# Patient Record
Sex: Female | Born: 1975 | Race: Black or African American | Hispanic: No | Marital: Single | Smoking: Current every day smoker
Health system: Southern US, Community
[De-identification: ages and names within clinical notes are randomized; demographics above are authoritative.]

## PROBLEM LIST (undated history)

## (undated) DIAGNOSIS — R569 Unspecified convulsions: Secondary | ICD-10-CM

## (undated) HISTORY — PX: FOOT SURGERY: SHX648

---

## 2016-05-24 ENCOUNTER — Other Ambulatory Visit: Payer: Self-pay | Admitting: Emergency Medicine

## 2016-10-30 ENCOUNTER — Encounter (HOSPITAL_COMMUNITY): Payer: Self-pay | Admitting: Emergency Medicine

## 2016-10-30 ENCOUNTER — Ambulatory Visit (HOSPITAL_COMMUNITY)
Admission: EM | Admit: 2016-10-30 | Discharge: 2016-10-30 | Disposition: A | Discharge status: Home / Self Care | Payer: BLUE CROSS/BLUE SHIELD | Attending: Family Medicine | Admitting: Family Medicine

## 2016-10-30 DIAGNOSIS — R0602 Shortness of breath: Secondary | ICD-10-CM | POA: Diagnosis not present

## 2016-10-30 DIAGNOSIS — F17200 Nicotine dependence, unspecified, uncomplicated: Secondary | ICD-10-CM

## 2016-10-30 DIAGNOSIS — J40 Bronchitis, not specified as acute or chronic: Secondary | ICD-10-CM | POA: Diagnosis not present

## 2016-10-30 DIAGNOSIS — R05 Cough: Secondary | ICD-10-CM

## 2016-10-30 HISTORY — DX: Unspecified convulsions: R56.9

## 2016-10-30 MED ORDER — AZITHROMYCIN 250 MG PO TABS: 250.0000 mg | ORAL_TABLET | Freq: Every day | ORAL | 0 refills | Status: AC

## 2016-10-30 MED ORDER — IPRATROPIUM-ALBUTEROL 0.5-2.5 (3) MG/3ML IN SOLN
3.0000 mL | Freq: Once | RESPIRATORY_TRACT | Status: AC
Start: 2016-10-30 — End: 2016-10-30
  Administered 2016-10-30: 3 mL via RESPIRATORY_TRACT

## 2016-10-30 MED ORDER — ALBUTEROL SULFATE HFA 108 (90 BASE) MCG/ACT IN AERS: 2.0000 | INHALATION_SPRAY | RESPIRATORY_TRACT | 1 refills | Status: AC | PRN

## 2016-10-30 MED ORDER — IPRATROPIUM-ALBUTEROL 0.5-2.5 (3) MG/3ML IN SOLN
RESPIRATORY_TRACT | Status: AC
  Filled 2016-10-30: qty 3

## 2016-10-30 MED ORDER — PREDNISONE 20 MG PO TABS: ORAL_TABLET | ORAL | 0 refills | Status: AC

## 2016-10-30 NOTE — ED Triage Notes (Signed)
The patient [facility] with a complaint of a cough x 1 week and SOB that started yesterday.

## 2016-10-30 NOTE — ED Provider Notes (Signed)
[facility]    CSN: [id number] Arrival date & time: 10/30/16  1613     History   Chief Complaint Chief Complaint  Patient presents with  . Cough    HPI Chloe Baker is a 41 y.o. female.   The patient presented to the [facility] with a complaint of a cough x 1 week and SOB that started yesterday. She's a smoker with h/o bronchitis.  No fever.      Past Medical History:  Diagnosis Date  . Seizure (HCC)     There are no active problems to display for this patient.   Past Surgical History:  Procedure Laterality Date  . FOOT SURGERY Left     OB History    No data available       Home Medications    Prior to Admission medications   Medication Sig Start Date End Date Taking? Authorizing Provider  lamoTRIgine (LAMICTAL) 25 MG tablet Take 25 mg by mouth daily.   Yes [provider]  levETIRAcetam (KEPPRA) 750 MG tablet Take 750 mg by mouth 2 (two) times daily.   Yes [provider]  topiramate (TOPAMAX) 50 MG tablet Take 50 mg by mouth 2 (two) times daily.   Yes [provider]  albuterol (PROVENTIL HFA;VENTOLIN HFA) 108 (90 Base) MCG/ACT inhaler Inhale 2 puffs into the lungs every 4 (four) hours as needed for wheezing or shortness of breath (cough, shortness of breath or wheezing.). 10/30/16   Elvina Sidle, MD  azithromycin (ZITHROMAX) 250 MG tablet Take 1 tablet (250 mg total) by mouth daily. Take first 2 tablets together, then 1 every day until finished. 10/30/16   Elvina Sidle, MD  predniSONE (DELTASONE) 20 MG tablet Two daily with food 10/30/16   Elvina Sidle, MD    Family History History reviewed. No pertinent family history.  Social History Social History  Substance Use Topics  . Smoking status: Current Every Day Smoker    Packs/day: 0.50    Years: 10.00    Types: Cigarettes  . Smokeless tobacco: Never Used  . Alcohol use No     Allergies   Penicillins   Review of Systems Review of Systems    Respiratory: Positive for cough, shortness of breath and wheezing.   All other systems reviewed and are negative.    Physical Exam Triage Vital Signs ED Triage Vitals [10/30/16 1635]  Enc Vitals Group     BP      Pulse      Resp      Temp      Temp src      SpO2      Weight      Height      Head Circumference      Peak Flow      Pain Score 0     Pain Loc      Pain Edu?      Excl. in GC?    No data found.   Updated Vital Signs LMP  (Exact Date)    Physical Exam  Constitutional: She is oriented to person, place, and time. She appears well-developed and well-nourished.  HENT:  Right Ear: External ear normal.  Left Ear: External ear normal.  Eyes: Conjunctivae are normal. Pupils are equal, round, and reactive to light.  Neck: Normal range of motion. Neck supple.  Cardiovascular: Normal rate, regular rhythm and normal heart sounds.   Pulmonary/Chest: Effort normal. She has wheezes.  Musculoskeletal: Normal range of motion.  Neurological: She  is alert and oriented to person, place, and time.  Skin: Skin is warm and dry.  Nursing note and vitals reviewed.    UC Treatments / Results  Labs (all labs ordered are listed, but only abnormal results are displayed) Labs Reviewed - No data to display  EKG  EKG Interpretation None       Radiology No results found.  Procedures Procedures (including critical care time)  Medications Ordered in UC Medications  ipratropium-albuterol (DUONEB) 0.5-2.5 (3) MG/3ML nebulizer solution 3 mL (not administered)     Initial Impression / Assessment and Plan / UC Course  I have reviewed the triage vital signs and the nursing notes.  Pertinent labs & imaging results that were available during my care of the patient were reviewed by me and considered in my medical decision making (see chart for details).     Final Clinical Impressions(s) / UC Diagnoses   Final diagnoses:  Bronchitis    New Prescriptions New  Prescriptions   ALBUTEROL (PROVENTIL HFA;VENTOLIN HFA) 108 (90 BASE) MCG/ACT INHALER    Inhale 2 puffs into the lungs every 4 (four) hours as needed for wheezing or shortness of breath (cough, shortness of breath or wheezing.).   AZITHROMYCIN (ZITHROMAX) 250 MG TABLET    Take 1 tablet (250 mg total) by mouth daily. Take first 2 tablets together, then 1 every day until finished.   PREDNISONE (DELTASONE) 20 MG TABLET    Two daily with food     Elvina SidleLauenstein, Grason Brailsford, MD 10/30/16 [phone]

## 2018-07-27 ENCOUNTER — Emergency Department (HOSPITAL_COMMUNITY)
Admission: EM | Admit: 2018-07-27 | Discharge: 2018-07-27 | Disposition: A | Discharge status: Home / Self Care | Payer: Medicaid Other | Attending: Emergency Medicine | Admitting: Emergency Medicine

## 2018-07-27 ENCOUNTER — Encounter (HOSPITAL_COMMUNITY): Payer: Self-pay | Admitting: Obstetrics and Gynecology

## 2018-07-27 ENCOUNTER — Emergency Department (HOSPITAL_COMMUNITY): Payer: Medicaid Other

## 2018-07-27 ENCOUNTER — Other Ambulatory Visit: Payer: Self-pay

## 2018-07-27 DIAGNOSIS — R569 Unspecified convulsions: Secondary | ICD-10-CM | POA: Diagnosis not present

## 2018-07-27 DIAGNOSIS — Z79899 Other long term (current) drug therapy: Secondary | ICD-10-CM | POA: Diagnosis not present

## 2018-07-27 DIAGNOSIS — F17210 Nicotine dependence, cigarettes, uncomplicated: Secondary | ICD-10-CM | POA: Diagnosis not present

## 2018-07-27 LAB — URINALYSIS, ROUTINE W REFLEX MICROSCOPIC
Bilirubin Urine: NEGATIVE
Glucose, UA: NEGATIVE mg/dL
KETONES UR: NEGATIVE mg/dL
Leukocytes,Ua: NEGATIVE
Nitrite: NEGATIVE
PH: 7 (ref 5.0–8.0)
Protein, ur: NEGATIVE mg/dL
Specific Gravity, Urine: 1.004 — ABNORMAL LOW (ref 1.005–1.030)

## 2018-07-27 LAB — CBC WITH DIFFERENTIAL/PLATELET
Abs Immature Granulocytes: 0.02 10*3/uL (ref 0.00–0.07)
Basophils Absolute: 0 10*3/uL (ref 0.0–0.1)
Basophils Relative: 0 %
Eosinophils Absolute: 0 10*3/uL (ref 0.0–0.5)
Eosinophils Relative: 0 %
HCT: 41.5 % (ref 36.0–46.0)
Hemoglobin: 13.4 g/dL (ref 12.0–15.0)
Immature Granulocytes: 0 %
Lymphocytes Relative: 20 %
Lymphs Abs: 1.5 10*3/uL (ref 0.7–4.0)
MCH: 29.6 pg (ref 26.0–34.0)
MCHC: 32.3 g/dL (ref 30.0–36.0)
MCV: 91.8 fL (ref 80.0–100.0)
MONO ABS: 0.4 10*3/uL (ref 0.1–1.0)
Monocytes Relative: 6 %
Neutro Abs: 5.4 10*3/uL (ref 1.7–7.7)
Neutrophils Relative %: 74 %
Platelets: 230 10*3/uL (ref 150–400)
RBC: 4.52 MIL/uL (ref 3.87–5.11)
RDW: 14.8 % (ref 11.5–15.5)
WBC: 7.3 10*3/uL (ref 4.0–10.5)
nRBC: 0 % (ref 0.0–0.2)

## 2018-07-27 LAB — BASIC METABOLIC PANEL
Anion gap: 7 (ref 5–15)
BUN: 10 mg/dL (ref 6–20)
CO2: 22 mmol/L (ref 22–32)
Calcium: 8.6 mg/dL — ABNORMAL LOW (ref 8.9–10.3)
Chloride: 109 mmol/L (ref 98–111)
Creatinine, Ser: 0.78 mg/dL (ref 0.44–1.00)
GFR calc Af Amer: >60 mL/min (ref >60)
GFR calc non Af Amer: >60 mL/min (ref >60)
Glucose, Bld: 108 mg/dL — ABNORMAL HIGH (ref 70–99)
Potassium: 3.9 mmol/L (ref 3.5–5.1)
Sodium: 138 mmol/L (ref 135–145)

## 2018-07-27 LAB — I-STAT BETA HCG BLOOD, ED (MC, WL, AP ONLY): I-stat hCG, quantitative: <5 m[IU]/mL (ref <5)

## 2018-07-27 MED ORDER — TOPIRAMATE 50 MG PO TABS: ORAL_TABLET | ORAL | 0 refills | Status: AC

## 2018-07-27 MED ORDER — SODIUM CHLORIDE 0.9 % IV SOLN
INTRAVENOUS | Status: DC
  Administered 2018-07-27: 11:00:00 via INTRAVENOUS

## 2018-07-27 MED ORDER — LORAZEPAM 2 MG/ML IJ SOLN: 1.0000 mg | Freq: Once | INTRAMUSCULAR | Status: DC | PRN

## 2018-07-27 MED ORDER — LEVETIRACETAM IN NACL 1000 MG/100ML IV SOLN
1000.0000 mg | Freq: Once | INTRAVENOUS | Status: AC
  Administered 2018-07-27: 1000 mg via INTRAVENOUS
  Filled 2018-07-27: qty 100

## 2018-07-27 NOTE — ED Triage Notes (Signed)
Per EMS: Pt is coming from work. Pt reportedly had 4 seizures lasting 20 seconds with 1 minute in between. Pt has a hx of seizures with last one occurring 6 weeks ago. Pt is supposed to be on 1000mg  of keppra and reports she is compliant. Pt reports stress brings on her seizures. Pt reportedly bit her tongue. Pt is not currently showing any signs of post ictal. Pt is alert and oriented x4 at this time.  Pt's vitals per EMS: SPO2 98% on RA CBG 141 HR 120 Patient denies neck or back pain.

## 2018-07-27 NOTE — Discharge Instructions (Signed)
No driving until cleared by neurology

## 2018-07-27 NOTE — ED Notes (Signed)
Patient stuck x3 for bloodwork and IV access without success. RN Whitney Post attempting ultrasound IV

## 2018-07-27 NOTE — ED Provider Notes (Signed)
[facility]EM[facility]GENCY [facility]PT Provider Note   CSN: [id number] Arrival date & time: 07/27/18  4098    History   Chief Complaint Chief Complaint  Patient presents with  . Seizures    HPI Chloe Baker is a 43 y.o. female.     Pt presents to the ED today with a seizure.  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  Each one lasted about 20 seconds.  She is on 1000 mg keppra BID and Lamictal 100 mg TID and has been compliant.  She did not take her dose today, but normally takes it when she gets home.  This dose was increased after her last seizure on 2/9.  She had been weaned off the topamax about 3 months ago. The pt has been under stress and has not been sleeping well.  She did bite her tongue and the inside of her lower lip.  She also hit her right hand.  She is awake and alert now.     Past Medical History:  Diagnosis Date  . Seizure (HCC)     There are no active problems to display for this patient.   Past Surgical History:  Procedure Laterality Date  . FOOT SURGERY Left      OB History   No obstetric history on file.      Home Medications    Prior to Admission medications   Medication Sig Start Date End Date Taking? Authorizing Provider  lamoTRIgine (LAMICTAL) 100 MG tablet Take 50-100 mg by mouth See admin instructions.  in the morning and  before bed   Yes [provider]  levETIRAcetam (KEPPRA) 1000 MG tablet Take 1,000 mg by mouth 2 (two) times daily.   Yes [provider]  albuterol (PROVENTIL HFA;VENTOLIN HFA) 108 (90 Base) MCG/ACT inhaler Inhale 2 puffs into the lungs every 4 (four) hours as needed for wheezing or shortness of breath (cough, shortness of breath or wheezing.). Patient not taking: Reported on 07/27/2018 10/30/16   Elvina Sidle, MD  azithromycin (ZITHROMAX) 250 MG tablet Take 1 tablet (250 mg total) by mouth daily. Take first 2 tablets together, then 1 every day  until finished. Patient not taking: Reported on 07/27/2018 10/30/16   Elvina Sidle, MD  FLUoxetine (PROZAC) 20 MG capsule Take 20 mg by mouth daily. 07/24/18   [provider]  predniSONE (DELTASONE) 20 MG tablet Two daily with food Patient not taking: Reported on 07/27/2018 10/30/16   Elvina Sidle, MD  topiramate (TOPAMAX) 50 MG tablet Take 50 mg per day for a week, then increase to 100 mg per day. 07/27/18   Jacalyn Lefevre, MD    Family History No family history on file.  Social History Social History   Tobacco Use  . Smoking status: Current Every Day Smoker    Packs/day: 0.50    Years: 10.00    Pack years: 5.00    Types: Cigarettes  . Smokeless tobacco: Never Used  Substance Use Topics  . Alcohol use: No  . Drug use: No     Allergies   Penicillins   Review of Systems Review of Systems  Musculoskeletal:       Right hand pain  Neurological: Positive for seizures.  All other systems reviewed and are negative.    Physical Exam Updated Vital Signs BP (!) 141/86   Pulse 94   Temp 98.7 F (37.1 C) (Oral)   Resp 20   SpO2 100%  Physical Exam Vitals signs and nursing note reviewed.  Constitutional:      Appearance: Normal appearance.  HENT:     Head: Normocephalic and atraumatic.     Comments: Bite to tip of tongue. Small lac inner lower lip.    Right Ear: External ear normal.     Left Ear: External ear normal.     Nose: Nose normal.  Eyes:     Extraocular Movements: Extraocular movements intact.     Pupils: Pupils are equal, round, and reactive to light.  Neck:     Musculoskeletal: Normal range of motion and neck supple.  Cardiovascular:     Rate and Rhythm: Normal rate and regular rhythm.  Pulmonary:     Effort: Pulmonary effort is normal.     Breath sounds: Normal breath sounds.  Abdominal:     General: Abdomen is flat.  Musculoskeletal: Normal range of motion.       Arms:  Skin:    General: Skin is warm.     Capillary Refill:  Capillary refill takes less than 2 seconds.  Neurological:     General: No focal deficit present.     Mental Status: She is alert and oriented to person, place, and time.  Psychiatric:        Mood and Affect: Mood normal.        Behavior: Behavior normal.      ED Treatments / Results  Labs (all labs ordered are listed, but only abnormal results are displayed) Labs Reviewed  BASIC METABOLIC PANEL - Abnormal; Notable for the following components:      Result Value   Glucose, Bld 108 (*)    Calcium 8.6 (*)    All other components within normal limits  URINALYSIS, ROUTINE W REFLEX MICROSCOPIC - Abnormal; Notable for the following components:   Color, Urine STRAW (*)    Specific Gravity, Urine 1.004 (*)    Hgb urine dipstick SMALL (*)    Bacteria, UA RARE (*)    All other components within normal limits  CBC WITH DIFFERENTIAL/PLATELET  CBG MONITORING, ED  I-STAT BETA HCG BLOOD, ED ([facility], [facility], [facility] ONLY)    EKG None  Radiology Dg Hand Complete Right  Result Date: 07/27/2018 CLINICAL DATA:  Seizure.  Abrasions right fourth and fifth digits. EXAM: RIGHT HAND - COMPLETE 3+ VIEW COMPARISON:  No recent prior. FINDINGS: No acute bony or joint abnormality identified. No evidence of fracture dislocation. IMPRESSION: No acute abnormality. Electronically Signed   By: Maisie Fus  Register   On: 07/27/2018 10:10    Procedures Procedures (including critical care time)  Medications Ordered in ED Medications  0.9 %  sodium chloride infusion ( Intravenous New Bag/Given 07/27/18 1046)  LORazepam (ATIVAN) injection 1 mg (has no administration in time range)  levETIRAcetam (KEPPRA) IVPB 1000 mg/100 mL premix (1,000 mg Intravenous New Bag/Given 07/27/18 1048)     Initial Impression / Assessment and Plan / ED Course  I have reviewed the triage vital signs and the nursing notes.  Pertinent labs & imaging results that were available during my care of the patient were reviewed by me and considered in my  medical decision making (see chart for details).   Pt has not had any further seizures.  She did receive 1 g keppra in the ED.   She is told not to drive until cleared by her neurologist.  I did speak to Dr. Loleta Chance (pt's neurologist at [facility] neuro in [place]) who will see her next week.  In the interim, I am going to start her back on Topamax at 50 mg/day for the first week, then increase by 50 mg/day  Final Clinical Impressions(s) / ED Diagnoses   Final diagnoses:  Seizure [facility])    ED Discharge Orders         Ordered    topiramate (TOPAMAX) 50 MG tablet     07/27/18 1155           Jacalyn Lefevre, MD 07/27/18 1157

## 2018-07-27 NOTE — ED Notes (Signed)
RN Whitney Post stuck patient x2 without success.

## 2018-07-27 NOTE — ED Notes (Signed)
Charge RN attempted IV at this time

## 2018-07-27 NOTE — ED Notes (Signed)
Bed: WA17 Expected date:  Expected time:  Means of arrival:  Comments: EMS-[facility] 

## 2020-01-16 IMAGING — DX RIGHT HAND - COMPLETE 3+ VIEW
3 series · 3 of 3 positions shown · non-contrast
Comparison: No recent prior.

CLINICAL DATA: Seizure.  Abrasions right fourth and fifth digits.

EXAM:
RIGHT HAND - COMPLETE 3+ VIEW

[hand ap]
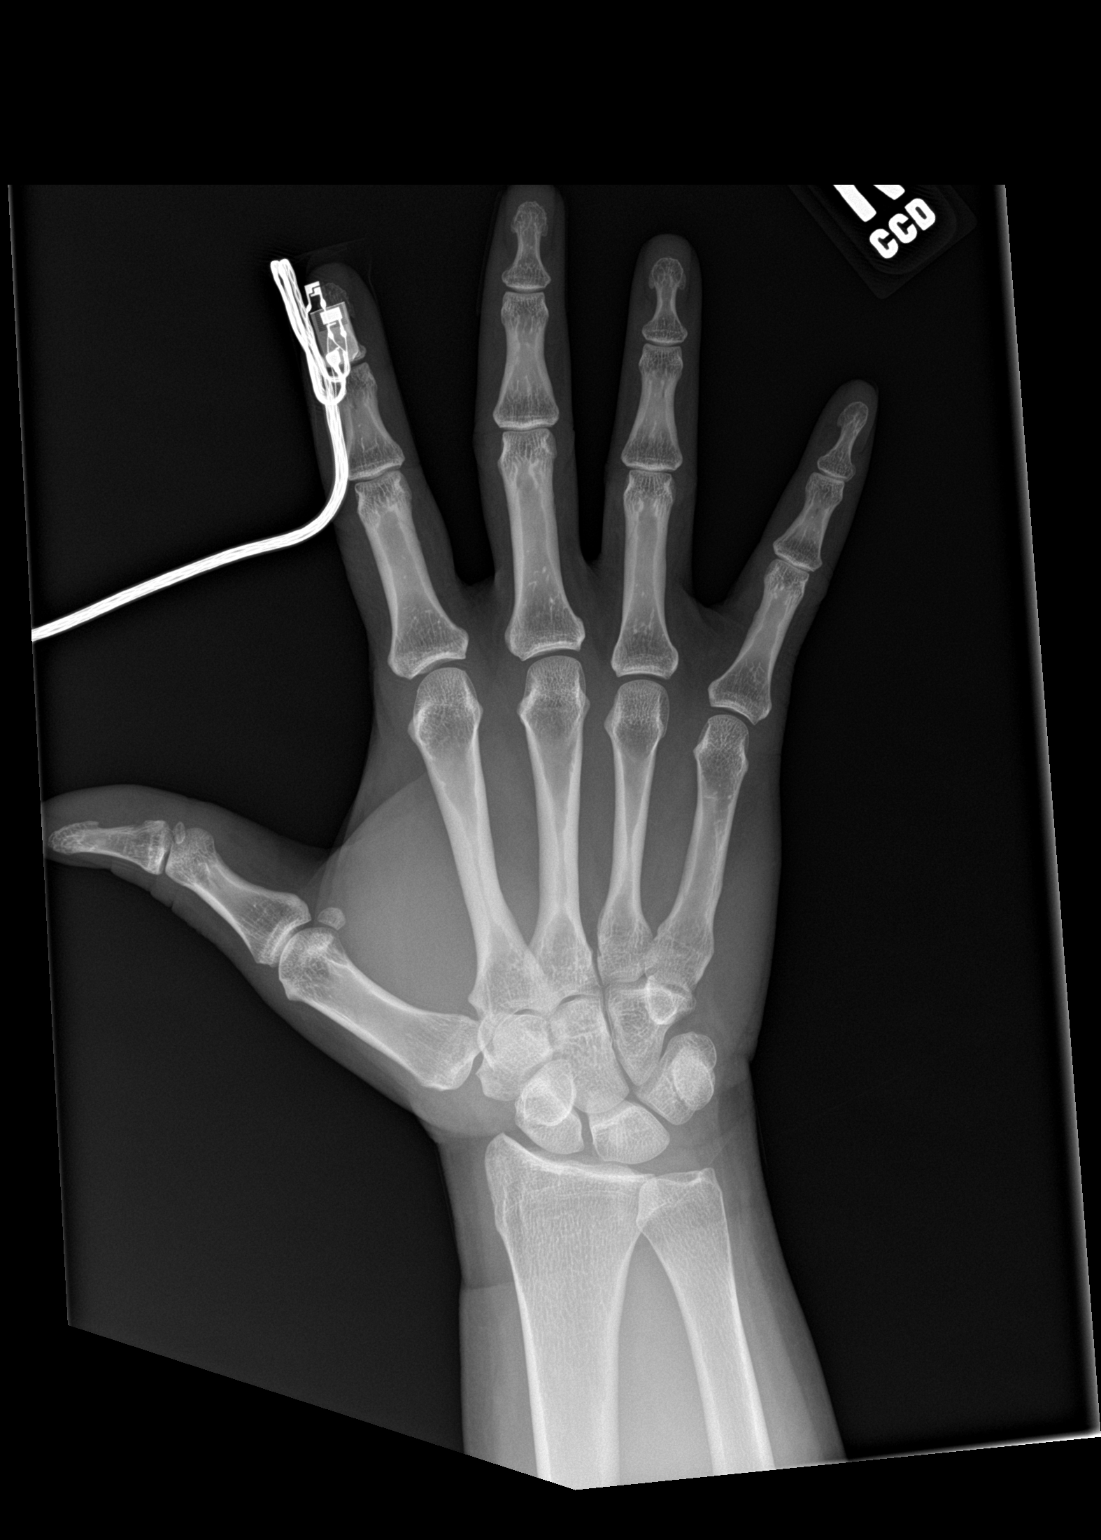

[hand obl]
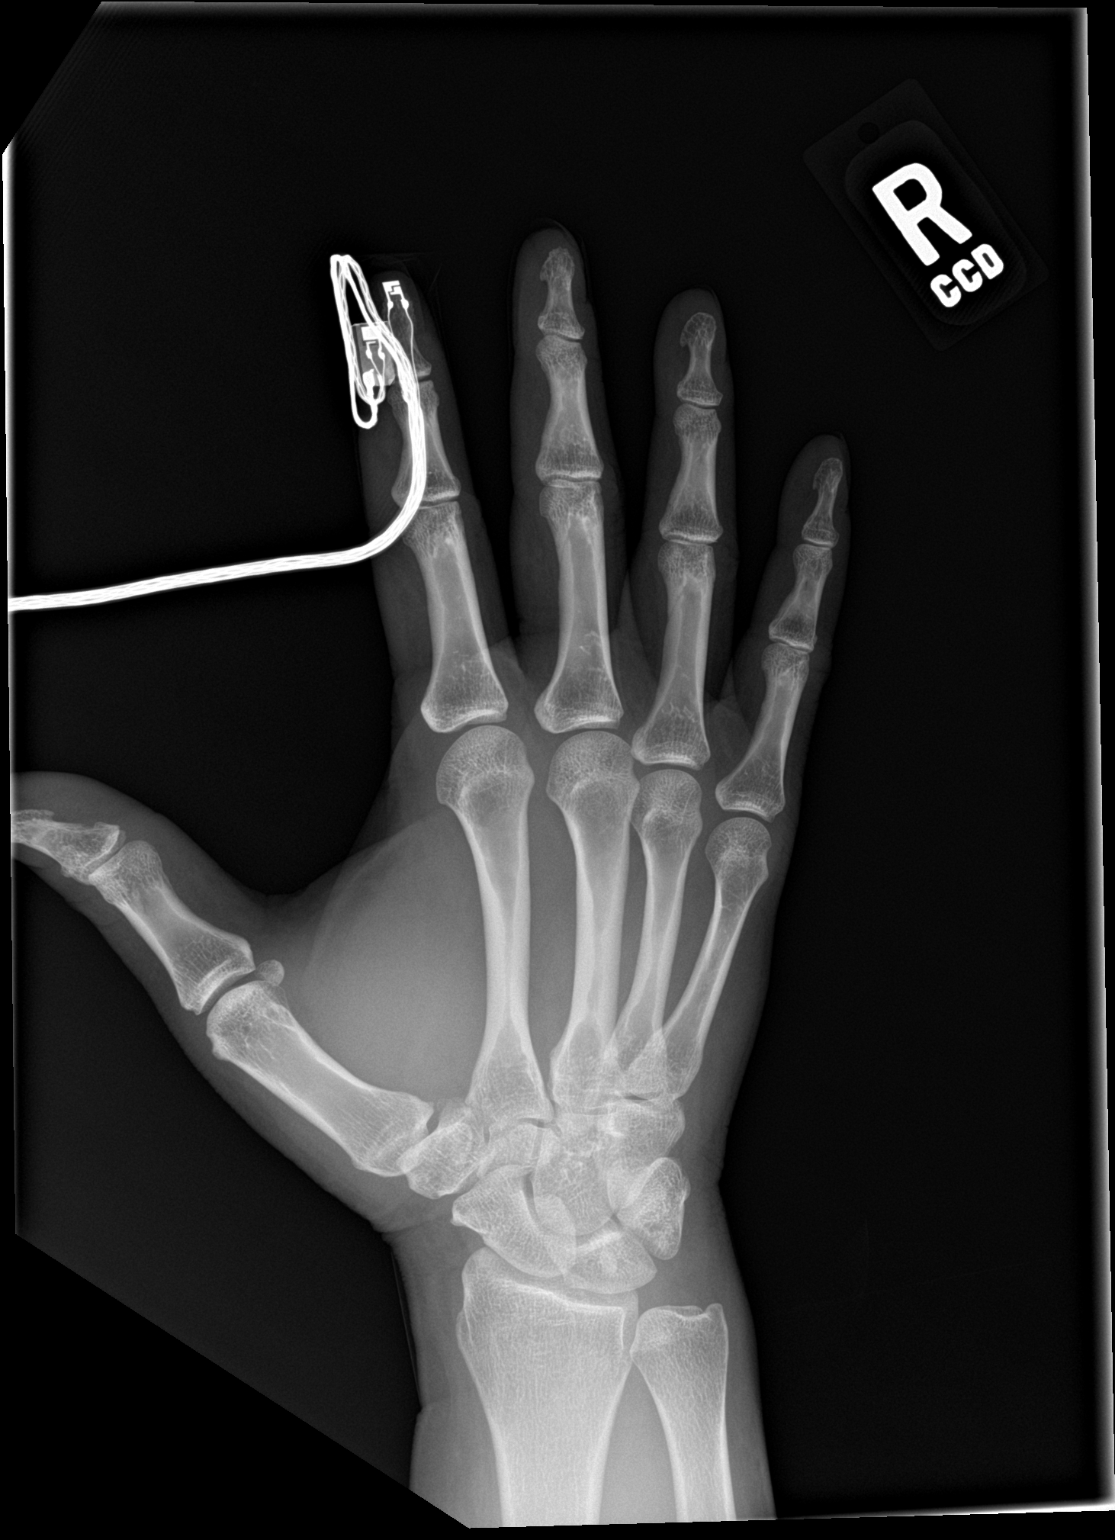

[hand lat]
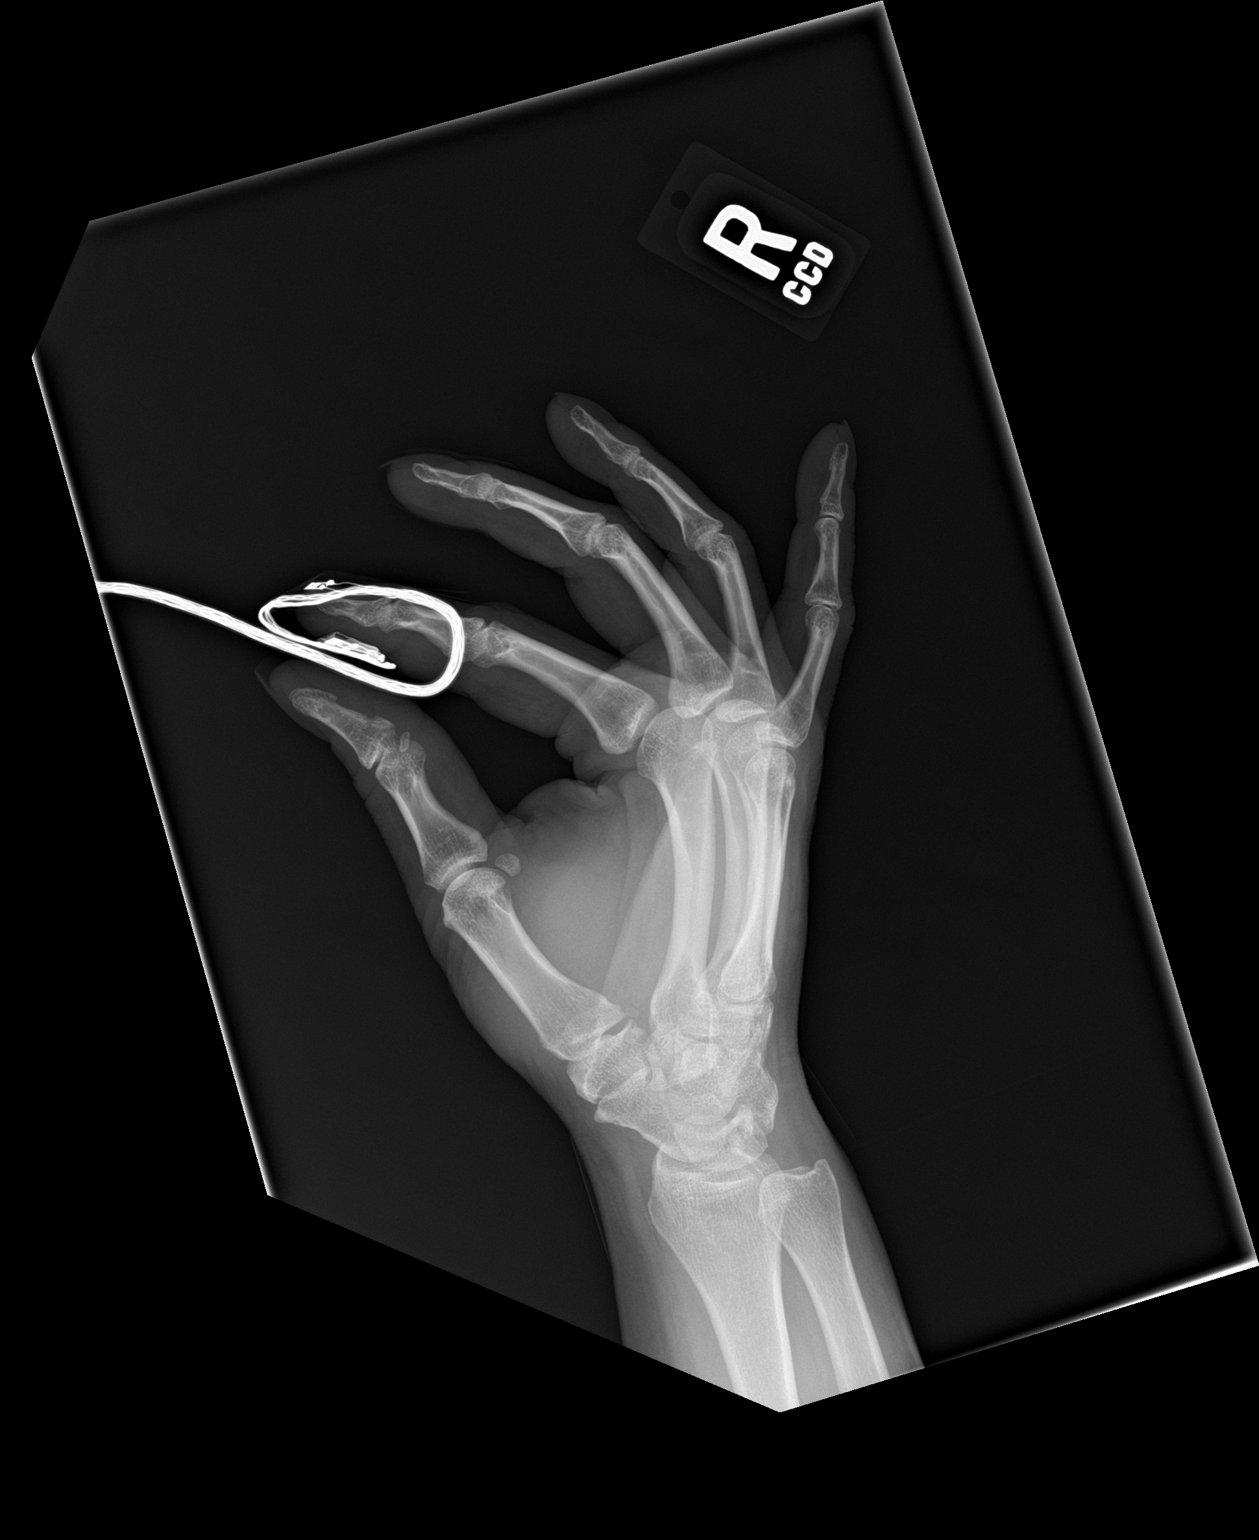

[3 of 3 positions shown; findings below may reference images not displayed]

FINDINGS: No acute bony or joint abnormality identified. No evidence of
fracture dislocation.
IMPRESSION: No acute abnormality.
